# Patient Record
Sex: Male | Born: 1985 | Race: Black or African American | Hispanic: No | Marital: Married | Smoking: Current every day smoker
Health system: Southern US, Community
[De-identification: ages and names within clinical notes are randomized; demographics above are authoritative.]

## PROBLEM LIST (undated history)

## (undated) DIAGNOSIS — J309 Allergic rhinitis, unspecified: Secondary | ICD-10-CM

## (undated) DIAGNOSIS — M1990 Unspecified osteoarthritis, unspecified site: Secondary | ICD-10-CM

## (undated) DIAGNOSIS — J45909 Unspecified asthma, uncomplicated: Secondary | ICD-10-CM

## (undated) HISTORY — PX: VASECTOMY: SHX75

## (undated) HISTORY — PX: HERNIA REPAIR: SHX51

---

## 2016-01-27 ENCOUNTER — Emergency Department (HOSPITAL_BASED_OUTPATIENT_CLINIC_OR_DEPARTMENT_OTHER)
Admission: EM | Admit: 2016-01-27 | Discharge: 2016-01-27 | Disposition: A | Discharge status: Home / Self Care | Payer: No Typology Code available for payment source | Attending: Emergency Medicine | Admitting: Emergency Medicine

## 2016-01-27 ENCOUNTER — Emergency Department (HOSPITAL_BASED_OUTPATIENT_CLINIC_OR_DEPARTMENT_OTHER): Payer: No Typology Code available for payment source

## 2016-01-27 ENCOUNTER — Encounter (HOSPITAL_BASED_OUTPATIENT_CLINIC_OR_DEPARTMENT_OTHER): Payer: Self-pay | Admitting: Emergency Medicine

## 2016-01-27 DIAGNOSIS — T148 Other injury of unspecified body region: Secondary | ICD-10-CM | POA: Insufficient documentation

## 2016-01-27 DIAGNOSIS — Y998 Other external cause status: Secondary | ICD-10-CM | POA: Diagnosis not present

## 2016-01-27 DIAGNOSIS — S3992XA Unspecified injury of lower back, initial encounter: Secondary | ICD-10-CM | POA: Insufficient documentation

## 2016-01-27 DIAGNOSIS — Y9389 Activity, other specified: Secondary | ICD-10-CM | POA: Insufficient documentation

## 2016-01-27 DIAGNOSIS — F17210 Nicotine dependence, cigarettes, uncomplicated: Secondary | ICD-10-CM | POA: Insufficient documentation

## 2016-01-27 DIAGNOSIS — Y92410 Unspecified street and highway as the place of occurrence of the external cause: Secondary | ICD-10-CM | POA: Diagnosis not present

## 2016-01-27 DIAGNOSIS — T148XXA Other injury of unspecified body region, initial encounter: Secondary | ICD-10-CM

## 2016-01-27 DIAGNOSIS — S29001A Unspecified injury of muscle and tendon of front wall of thorax, initial encounter: Secondary | ICD-10-CM | POA: Insufficient documentation

## 2016-01-27 DIAGNOSIS — S0990XA Unspecified injury of head, initial encounter: Secondary | ICD-10-CM | POA: Diagnosis present

## 2016-01-27 MED ORDER — IBUPROFEN 800 MG PO TABS
800.0000 mg | ORAL_TABLET | Freq: Once | ORAL | Status: AC
  Administered 2016-01-27: 800 mg via ORAL
  Filled 2016-01-27: qty 1

## 2016-01-27 MED ORDER — CYCLOBENZAPRINE HCL 10 MG PO TABS: 10.0000 mg | ORAL_TABLET | Freq: Two times a day (BID) | ORAL | Status: DC | PRN

## 2016-01-27 MED ORDER — IBUPROFEN 800 MG PO TABS: 800.0000 mg | ORAL_TABLET | Freq: Three times a day (TID) | ORAL | Status: DC
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
## 2016-01-27 NOTE — ED Notes (Signed)
MD at bedside. 
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
## 2018-03-29 ENCOUNTER — Encounter (HOSPITAL_BASED_OUTPATIENT_CLINIC_OR_DEPARTMENT_OTHER): Payer: Self-pay

## 2018-03-29 ENCOUNTER — Emergency Department (HOSPITAL_BASED_OUTPATIENT_CLINIC_OR_DEPARTMENT_OTHER)
Admission: EM | Admit: 2018-03-29 | Discharge: 2018-03-29 | Disposition: A | Discharge status: Home / Self Care | Payer: Medicaid Other | Attending: Emergency Medicine | Admitting: Emergency Medicine

## 2018-03-29 ENCOUNTER — Emergency Department (HOSPITAL_BASED_OUTPATIENT_CLINIC_OR_DEPARTMENT_OTHER): Payer: Medicaid Other

## 2018-03-29 ENCOUNTER — Other Ambulatory Visit: Payer: Self-pay

## 2018-03-29 DIAGNOSIS — J029 Acute pharyngitis, unspecified: Secondary | ICD-10-CM | POA: Diagnosis present

## 2018-03-29 DIAGNOSIS — F17210 Nicotine dependence, cigarettes, uncomplicated: Secondary | ICD-10-CM | POA: Insufficient documentation

## 2018-03-29 DIAGNOSIS — J039 Acute tonsillitis, unspecified: Secondary | ICD-10-CM | POA: Diagnosis not present

## 2018-03-29 DIAGNOSIS — J4521 Mild intermittent asthma with (acute) exacerbation: Secondary | ICD-10-CM | POA: Insufficient documentation

## 2018-03-29 HISTORY — DX: Allergic rhinitis, unspecified: J30.9

## 2018-03-29 LAB — RAPID STREP SCREEN (MED CTR MEBANE ONLY): STREPTOCOCCUS, GROUP A SCREEN (DIRECT): NEGATIVE

## 2018-03-29 MED ORDER — AZITHROMYCIN 250 MG PO TABS: ORAL_TABLET | ORAL | 0 refills | Status: DC

## 2018-03-29 MED ORDER — AZITHROMYCIN 250 MG PO TABS
500.0000 mg | ORAL_TABLET | Freq: Once | ORAL | Status: AC
  Administered 2018-03-29: 500 mg via ORAL
  Filled 2018-03-29: qty 2

## 2018-03-29 MED ORDER — ALBUTEROL SULFATE (2.5 MG/3ML) 0.083% IN NEBU: 2.5000 mg | INHALATION_SOLUTION | Freq: Four times a day (QID) | RESPIRATORY_TRACT | 12 refills | Status: AC | PRN

## 2018-03-29 MED ORDER — PREDNISONE 10 MG (21) PO TBPK: ORAL_TABLET | ORAL | 0 refills | Status: DC

## 2018-03-29 MED ORDER — AEROCHAMBER PLUS FLO-VU MEDIUM MISC
1.0000 | Freq: Once | Status: AC
  Administered 2018-03-29: 1
  Filled 2018-03-29: qty 1

## 2018-03-29 MED ORDER — ALBUTEROL SULFATE (2.5 MG/3ML) 0.083% IN NEBU
2.5000 mg | INHALATION_SOLUTION | Freq: Once | RESPIRATORY_TRACT | Status: AC
  Administered 2018-03-29: 2.5 mg via RESPIRATORY_TRACT
  Filled 2018-03-29: qty 3

## 2018-03-29 MED ORDER — ALBUTEROL SULFATE HFA 108 (90 BASE) MCG/ACT IN AERS
1.0000 | INHALATION_SPRAY | RESPIRATORY_TRACT | Status: DC | PRN
  Administered 2018-03-29: 2 via RESPIRATORY_TRACT
  Filled 2018-03-29: qty 6.7

## 2018-03-29 MED ORDER — IPRATROPIUM-ALBUTEROL 0.5-2.5 (3) MG/3ML IN SOLN
3.0000 mL | Freq: Four times a day (QID) | RESPIRATORY_TRACT | Status: DC
  Administered 2018-03-29: 3 mL via RESPIRATORY_TRACT
  Filled 2018-03-29: qty 3

## 2018-03-29 MED ORDER — DEXAMETHASONE SODIUM PHOSPHATE 10 MG/ML IJ SOLN
10.0000 mg | Freq: Once | INTRAMUSCULAR | Status: AC
  Administered 2018-03-29: 10 mg via INTRAVENOUS
  Filled 2018-03-29: qty 1

## 2018-03-29 MED ORDER — IBUPROFEN 800 MG PO TABS: 800.0000 mg | ORAL_TABLET | Freq: Three times a day (TID) | ORAL | 0 refills | Status: DC

## 2018-03-29 NOTE — ED Provider Notes (Signed)
[facility] EMERGENCY DEPARTMENT Provider Note   CSN: [id number] Arrival date & time: 03/29/18  0805     History   Chief Complaint Chief Complaint  Patient presents with  . Sore Throat    HPI Nathan Logan is a 32 y.o. male.  Pt presents to the ED today with sore throat and cough.  Pt said sx have been going on since Monday the 22nd and not getting better.  Pt has not had a fever.  He has a hx of asthma and is out of his albuterol neb soln.  He is able to drink and to eat.     Past Medical History:  Diagnosis Date  . Allergic rhinitis     There are no active problems to display for this patient.   Past Surgical History:  Procedure Laterality Date  . HERNIA REPAIR          Home Medications    Prior to Admission medications   Medication Sig Start Date End Date Taking? Authorizing Provider  azithromycin (ZITHROMAX) 250 MG tablet Take 1 tablet daily 03/30/18   Jacalyn Lefevre, MD  cyclobenzaprine (FLEXERIL) 10 MG tablet Take 1 tablet (10 mg total) by mouth 2 (two) times daily as needed for muscle spasms. 01/27/16   Tilden Fossa, MD  ibuprofen (ADVIL,MOTRIN) 800 MG tablet Take 1 tablet (800 mg total) by mouth 3 (three) times daily. 03/29/18   Jacalyn Lefevre, MD  predniSONE (STERAPRED UNI-PAK 21 TAB) 10 MG (21) TBPK tablet Take 6 tabs by mouth daily  for 2 days, then 5 tabs for 2 days, then 4 tabs for 2 days, then 3 tabs for 2 days, 2 tabs for 2 days, then 1 tab by mouth daily for 2 days 03/29/18   Jacalyn Lefevre, MD    Family History History reviewed. No pertinent family history.  Social History Social History   Tobacco Use  . Smoking status: Current Every Day Smoker    Packs/day: 1.00    Types: Cigarettes  . Smokeless tobacco: Former Engineer, water Use Topics  . Alcohol use: Yes    Comment: occasional  . Drug use: Never     Allergies   Patient has no known allergies.   Review of Systems Review of Systems  HENT: Positive for  sore throat.   Respiratory: Positive for cough and wheezing.   All other systems reviewed and are negative.    Physical Exam Updated Vital Signs BP (!) 143/94 (BP Location: Right Arm)   Pulse 77   Temp 98.4 F (36.9 C) (Oral)   Resp 16   Ht  (1.88 m)   Wt 127 kg (280 lb)   SpO2 98%   BMI 35.95 kg/m   Physical Exam  Constitutional: He is oriented to person, place, and time. He appears well-developed and well-nourished.  HENT:  Head: Normocephalic and atraumatic.  Right Ear: Hearing, tympanic membrane and ear canal normal.  Left Ear: Hearing, tympanic membrane and ear canal normal.  Mouth/Throat: Uvula is midline and mucous membranes are normal. Posterior oropharyngeal erythema present.  Eyes: Pupils are equal, round, and reactive to light. EOM are normal.  Neck: Normal range of motion. Neck supple.  Cardiovascular: Normal rate, regular rhythm, normal heart sounds and intact distal pulses.  Pulmonary/Chest: He has wheezes.  Abdominal: Soft. Bowel sounds are normal.  Lymphadenopathy:    He has cervical adenopathy.  Neurological: He is alert and oriented to person, place, and time.  Skin: Skin is warm and  dry. Capillary refill takes less than 2 seconds.  Psychiatric: He has a normal mood and affect. His behavior is normal.  Nursing note and vitals reviewed.    ED Treatments / Results  Labs (all labs ordered are listed, but only abnormal results are displayed) Labs Reviewed  RAPID STREP SCREEN ([facility] ONLY)  CULTURE, GROUP A STREP [facility])    EKG None  Radiology Dg Chest 2 View  Result Date: 03/29/2018 CLINICAL DATA:  Cough, chest congestion and sore throat for 6 days. EXAM: CHEST - 2 VIEW COMPARISON:  04/13/2017 FINDINGS: The heart size and mediastinal contours are within normal limits. Both lungs are clear. The visualized skeletal structures are unremarkable. IMPRESSION: No active cardiopulmonary disease. Electronically Signed   By: Myles Rosenthal M.D.   On:  03/29/2018 09:27    Procedures Procedures (including critical care time)  Medications Ordered in ED Medications  ipratropium-albuterol (DUONEB) 0.5-2.5 (3) MG/3ML nebulizer solution 3 mL (3 mLs Nebulization Given 03/29/18 0854)  albuterol (PROVENTIL HFA;VENTOLIN HFA) 108 (90 Base) MCG/ACT inhaler 1-2 puff (has no administration in time range)  AEROCHAMBER PLUS FLO-VU MEDIUM MISC 1 each (has no administration in time range)  azithromycin (ZITHROMAX) tablet 500 mg (has no administration in time range)  albuterol (PROVENTIL) (2.5 MG/3ML) 0.083% nebulizer solution 2.5 mg (has no administration in time range)  dexamethasone (DECADRON) injection 10 mg (10 mg Intravenous Given 03/29/18 0845)     Initial Impression / Assessment and Plan / ED Course  I have reviewed the triage vital signs and the nursing notes.  Pertinent labs & imaging results that were available during my care of the patient were reviewed by me and considered in my medical decision making (see chart for details).    Pt is feeling better.  As sx have been going on for a week and tonsils look bad, he will be treated with abx.  He is encouraged to stop smoking.  He is given an albuterol inhaler with spacer prior to d/c.  He is told to return if worse.  Establish primary care.  Final Clinical Impressions(s) / ED Diagnoses   Final diagnoses:  Tonsillitis  Mild intermittent reactive airway disease with acute exacerbation    ED Discharge Orders        Ordered    azithromycin (ZITHROMAX) 250 MG tablet     03/29/18 0957    predniSONE (STERAPRED UNI-PAK 21 TAB) 10 MG (21) TBPK tablet     03/29/18 0957    ibuprofen (ADVIL,MOTRIN) 800 MG tablet  3 times daily     03/29/18 0957       Jacalyn Lefevre, MD 03/29/18 [phone]

## 2018-03-29 NOTE — Discharge Instructions (Signed)
Try to stop smoking. °

## 2018-03-29 NOTE — ED Triage Notes (Addendum)
Pt reports sore throat since Monday. Denies fever. Associated cough.

## 2018-03-29 NOTE — ED Notes (Signed)
RT explained MDI with spacer to patient. He stated he has used them in the past and had no further questions. Left both at bedside with the patient.

## 2018-04-01 LAB — CULTURE, GROUP A STREP (THRC)

## 2019-06-07 ENCOUNTER — Other Ambulatory Visit: Payer: Self-pay

## 2019-06-07 ENCOUNTER — Emergency Department (HOSPITAL_BASED_OUTPATIENT_CLINIC_OR_DEPARTMENT_OTHER)
Admission: EM | Admit: 2019-06-07 | Discharge: 2019-06-07 | Disposition: A | Discharge status: Home / Self Care | Payer: Commercial Managed Care - PPO | Attending: Emergency Medicine | Admitting: Emergency Medicine

## 2019-06-07 ENCOUNTER — Encounter (HOSPITAL_BASED_OUTPATIENT_CLINIC_OR_DEPARTMENT_OTHER): Payer: Self-pay | Admitting: Emergency Medicine

## 2019-06-07 DIAGNOSIS — F17210 Nicotine dependence, cigarettes, uncomplicated: Secondary | ICD-10-CM | POA: Insufficient documentation

## 2019-06-07 DIAGNOSIS — M25561 Pain in right knee: Secondary | ICD-10-CM | POA: Diagnosis not present

## 2019-06-07 DIAGNOSIS — M79675 Pain in left toe(s): Secondary | ICD-10-CM | POA: Diagnosis not present

## 2019-06-07 HISTORY — DX: Unspecified osteoarthritis, unspecified site: M19.90

## 2019-06-07 LAB — SYNOVIAL CELL COUNT + DIFF, W/ CRYSTALS
Crystals, Fluid: NONE SEEN
Eosinophils-Synovial: 0 % (ref 0–1)
Lymphocytes-Synovial Fld: 16 % (ref 0–20)
Monocyte-Macrophage-Synovial Fluid: 78 % (ref 50–90)
Neutrophil, Synovial: 6 % (ref 0–25)
WBC, Synovial: 350 /mm3 — ABNORMAL HIGH (ref 0–200)

## 2019-06-07 MED ORDER — KETOROLAC TROMETHAMINE 30 MG/ML IJ SOLN
30.0000 mg | Freq: Once | INTRAMUSCULAR | Status: AC
  Administered 2019-06-07: 30 mg via INTRAMUSCULAR

## 2019-06-07 MED ORDER — LIDOCAINE-EPINEPHRINE (PF) 2 %-1:200000 IJ SOLN
20.0000 mL | Freq: Once | INTRAMUSCULAR | Status: DC
  Filled 2019-06-07: qty 20

## 2019-06-07 MED ORDER — NAPROXEN 500 MG PO TABS: 500.0000 mg | ORAL_TABLET | Freq: Two times a day (BID) | ORAL | 0 refills | Status: AC

## 2019-06-07 MED ORDER — HYDROCODONE-ACETAMINOPHEN 5-325 MG PO TABS: 1.0000 | ORAL_TABLET | ORAL | 0 refills | Status: DC | PRN

## 2019-06-07 MED ORDER — LIDOCAINE-EPINEPHRINE (PF) 2 %-1:200000 IJ SOLN
INTRAMUSCULAR | Status: AC
  Filled 2019-06-07: qty 10

## 2019-06-07 MED ORDER — KETOROLAC TROMETHAMINE 30 MG/ML IJ SOLN
INTRAMUSCULAR | Status: AC
  Filled 2019-06-07: qty 1

## 2019-06-07 NOTE — ED Notes (Signed)
Pt understood dc material. NAD noted. Scripts given at [employer]. All questions answered to satisfaction. Pt escorted to checkout counter. Pt given call back number to check on results.

## 2019-06-07 NOTE — ED Provider Notes (Signed)
[facility] EMERGENCY DEPARTMENT Provider Note   CSN: [id number] Arrival date & time: 06/07/19  0607     History   Chief Complaint Chief Complaint  Patient presents with  . Knee Pain  . Toe Pain    [facility] is a 33 y.[facility]. male.     HPI  This is a 33 year old male with no significant past medical history who presents with knee pain and toe pain.  Patient reports 1 week history of worsening left great toe pain.  He denies any injury.  He took an ibuprofen with minimal relief.  He has also noted pain and swelling over the lateral aspect of his right knee.  He denies any injury.  He denies any fevers or overlying skin changes.  No known history of gout.  Patient does report remote history of drinking but states he is not recently had any beer or alcohol.  Currently he rates his pain at 7 out of 10.  Past Medical History:  Diagnosis Date  . Allergic rhinitis   . Arthritis     There are no active problems to display for this patient.   Past Surgical History:  Procedure Laterality Date  . HERNIA REPAIR          Home Medications    Prior to Admission medications   Medication Sig Start Date End Date Taking? Authorizing Provider  albuterol (PROVENTIL) (2.5 MG/3ML) 0.083% nebulizer solution Take 3 mLs (2.5 mg total) by nebulization every 6 (six) hours as needed for wheezing or shortness of breath. 03/29/18   Jacalyn LefevreHaviland, Julie, MD  azithromycin (ZITHROMAX) 250 MG tablet Take 1 tablet daily 03/30/18   Jacalyn LefevreHaviland, Julie, MD  cyclobenzaprine (FLEXERIL) 10 MG tablet Take 1 tablet (10 mg total) by mouth 2 (two) times daily as needed for muscle spasms. 01/27/16   Tilden Fossaees, Elizabeth, MD  HYDROcodone-acetaminophen (NORCO/VICODIN) 5-325 MG tablet Take 1 tablet by mouth every 4 (four) hours as needed. 06/07/19   Kennidi Yoshida, Mayer Maskerourtney F, MD  ibuprofen (ADVIL,MOTRIN) 800 MG tablet Take 1 tablet (800 mg total) by mouth 3 (three) times daily. 03/29/18   Jacalyn LefevreHaviland, Julie, MD  naproxen  (NAPROSYN) 500 MG tablet Take 1 tablet (500 mg total) by mouth 2 (two) times daily. 06/07/19   Brynnly Bonet, Mayer Maskerourtney F, MD  predniSONE (STERAPRED UNI-PAK 21 TAB) 10 MG (21) TBPK tablet Take 6 tabs by mouth daily  for 2 days, then 5 tabs for 2 days, then 4 tabs for 2 days, then 3 tabs for 2 days, 2 tabs for 2 days, then 1 tab by mouth daily for 2 days 03/29/18   Jacalyn LefevreHaviland, Julie, MD    Family History No family history on file.  Social History Social History   Tobacco Use  . Smoking status: Current Every Day Smoker    Packs/day: 1.00    Types: Cigarettes  . Smokeless tobacco: Former Engineer, waterUser  Substance Use Topics  . Alcohol use: Yes    Comment: occasional  . Drug use: Never     Allergies   Patient has no known allergies.   Review of Systems Review of Systems  Constitutional: Negative for fever.  Musculoskeletal: Positive for joint swelling.       Right knee pain, left toe pain  Skin: Negative for color change and wound.  All other systems reviewed and are negative.    Physical Exam Updated Vital Signs BP (!) 126/91   Pulse 81   Temp 97.9 F (36.6 C) (Oral)   Resp 18  Ht 1.88 m (6\' 2" )   Wt 131.5 kg   SpO2 99%   BMI 37.23 kg/m   Physical Exam Vitals signs and nursing note reviewed.  Constitutional:      General: He is not in acute distress.    Appearance: He is well-developed. He is obese.  HENT:     Head: Normocephalic and atraumatic.  Cardiovascular:     Rate and Rhythm: Normal rate and regular rhythm.  Pulmonary:     Effort: Pulmonary effort is normal. No respiratory distress.  Musculoskeletal: Normal range of motion.     Comments: Focused examination of the right knee with normal range of motion both actively and passively, effusion noted over the right lateral joint line more prominently with the knee in flexion, no overlying skin changes, erythema, warmth Tenderness to palpation left MTP, no overlying skin changes or warmth, normal range of motion, 2+ DP pulse   Skin:    General: Skin is warm and dry.  Neurological:     Mental Status: He is alert and oriented to person, place, and time.      ED Treatments / Results  Labs (all labs ordered are listed, but only abnormal results are displayed) Labs Reviewed  BODY FLUID CULTURE  GRAM STAIN  SYNOVIAL CELL COUNT + DIFF, W/ CRYSTALS    EKG None  Radiology No results found.  Procedures .Joint Aspiration/Arthrocentesis  Date/Time: 06/07/2019 7:06 AM Performed by: Shon BatonHorton, Latroya Ng F, MD Authorized by: Shon BatonHorton, Cornell Gaber F, MD   Consent:    Consent obtained:  Written   Consent given by:  Patient   Risks discussed:  Bleeding, infection and incomplete drainage   Alternatives discussed:  No treatment Location:    Location:  Knee   Knee:  R knee Anesthesia (see MAR for exact dosages):    Anesthesia method:  Local infiltration   Local anesthetic:  Lidocaine 2% WITH epi Procedure details:    Preparation: Patient was prepped and draped in usual sterile fashion     Needle gauge:  18 G   Ultrasound guidance: yes     Approach:  Lateral   Aspirate amount:  3   Aspirate characteristics:  Serous and blood-tinged   Steroid injected: no     Specimen collected: yes   Post-procedure details:    Dressing:  Adhesive bandage   Patient tolerance of procedure:  Tolerated well, no immediate complications   (including critical care time)  Medications Ordered in ED Medications  lidocaine-EPINEPHrine (XYLOCAINE W/EPI) 2 %-1:200000 (PF) injection 20 mL (has no administration in time range)  ketorolac (TORADOL) 30 MG/ML injection 30 mg (30 mg Intramuscular Given 06/07/19 [id number])     Initial Impression / Assessment and Plan / ED Course  I have reviewed the triage vital signs and the nursing notes.  Pertinent labs & imaging results that were available during my care of the patient were reviewed by me and considered in my medical decision making (see chart for details).        Patient presents with  atraumatic right knee pain and left great toe pain.  He is overall nontoxic-appearing and vital signs are reassuring.  He is afebrile.  His exam is highly suspicious for gout given location of pain.  Given obvious effusion on exam, he underwent arthrocentesis with aspiration of serous, straw-colored fluid.  He tolerated the procedure well.  Patient was given Toradol.  Will treat with anti-inflammatories and a short course of Norco for presumed gout.  Fluid analysis is pending.  Patient was given a number to call back for results.  After history, exam, and medical workup I feel the patient has been appropriately medically screened and is safe for discharge home. Pertinent diagnoses were discussed with the patient. Patient was given return precautions.  Final Clinical Impressions(s) / ED Diagnoses   Final diagnoses:  Acute pain of right knee  Great toe pain, left    ED Discharge Orders         Ordered    naproxen (NAPROSYN) 500 MG tablet  2 times daily     06/07/19 0656    HYDROcodone-acetaminophen (NORCO/VICODIN) 5-325 MG tablet  Every 4 hours PRN     06/07/19 0656           Merryl Hacker, MD 06/07/19 [id number]

## 2019-06-07 NOTE — ED Notes (Signed)
ED Provider at bedside. 

## 2019-06-07 NOTE — ED Triage Notes (Signed)
Pt c/o of right "knee bump" that he noticed over the past two days but pain became worse this morning. Denies injury to knee. Also c/o left great toe pain at the knuckle. Denies recent injury. Took motrin around 2200 last night

## 2019-06-09 ENCOUNTER — Encounter: Payer: Self-pay | Admitting: Orthopaedic Surgery

## 2019-06-09 ENCOUNTER — Emergency Department (HOSPITAL_BASED_OUTPATIENT_CLINIC_OR_DEPARTMENT_OTHER)
Admission: EM | Admit: 2019-06-09 | Discharge: 2019-06-09 | Disposition: A | Discharge status: Home / Self Care | Payer: Commercial Managed Care - PPO | Attending: Emergency Medicine | Admitting: Emergency Medicine

## 2019-06-09 ENCOUNTER — Encounter (HOSPITAL_BASED_OUTPATIENT_CLINIC_OR_DEPARTMENT_OTHER): Payer: Self-pay | Admitting: Emergency Medicine

## 2019-06-09 ENCOUNTER — Ambulatory Visit: Payer: Self-pay

## 2019-06-09 ENCOUNTER — Ambulatory Visit (INDEPENDENT_AMBULATORY_CARE_PROVIDER_SITE_OTHER): Payer: Commercial Managed Care - PPO | Admitting: Orthopaedic Surgery

## 2019-06-09 ENCOUNTER — Other Ambulatory Visit: Payer: Self-pay

## 2019-06-09 DIAGNOSIS — M25561 Pain in right knee: Secondary | ICD-10-CM | POA: Insufficient documentation

## 2019-06-09 DIAGNOSIS — M25461 Effusion, right knee: Secondary | ICD-10-CM | POA: Diagnosis not present

## 2019-06-09 DIAGNOSIS — J45909 Unspecified asthma, uncomplicated: Secondary | ICD-10-CM | POA: Insufficient documentation

## 2019-06-09 DIAGNOSIS — F17210 Nicotine dependence, cigarettes, uncomplicated: Secondary | ICD-10-CM | POA: Insufficient documentation

## 2019-06-09 HISTORY — DX: Unspecified asthma, uncomplicated: J45.909

## 2019-06-09 MED ORDER — CELECOXIB 200 MG PO CAPS: 200.0000 mg | ORAL_CAPSULE | Freq: Two times a day (BID) | ORAL | 0 refills | Status: AC

## 2019-06-09 MED ORDER — BUPIVACAINE HCL 0.5 % IJ SOLN
2.0000 mL | INTRAMUSCULAR | Status: AC | PRN
  Administered 2019-06-09: 2 mL via INTRA_ARTICULAR

## 2019-06-09 MED ORDER — METHYLPREDNISOLONE ACETATE 40 MG/ML IJ SUSP
40.0000 mg | INTRAMUSCULAR | Status: AC | PRN
  Administered 2019-06-09: 40 mg via INTRA_ARTICULAR

## 2019-06-09 MED ORDER — LIDOCAINE HCL 1 % IJ SOLN
2.0000 mL | INTRAMUSCULAR | Status: AC | PRN
  Administered 2019-06-09: 2 mL

## 2019-06-09 NOTE — ED Triage Notes (Signed)
Right knee pain worse since being seen here 2 days ago.  Vicodin not helping.

## 2019-06-09 NOTE — ED Provider Notes (Signed)
[facility] EMERGENCY DEPARTMENT Provider Note   CSN: [id number] Arrival date & time: 06/09/19  5852    History   Chief Complaint Chief Complaint  Patient presents with  . Knee Pain    HPI [facility] is a 33 y.o. male.     Pt presents to the ED today with continued right knee pain.  Pt was initially seen here on 7/6 by Dr. Dina Rich.  She performed a knee arthrocentesis.  The cultures showed no growth and WBCs only slightly elevated.  Pt was d/c home on vicodin.  Pain is not improving.     Past Medical History:  Diagnosis Date  . Allergic rhinitis   . Arthritis   . Asthma     There are no active problems to display for this patient.   Past Surgical History:  Procedure Laterality Date  . HERNIA REPAIR          Home Medications    Prior to Admission medications   Medication Sig Start Date End Date Taking? Authorizing Provider  albuterol (PROVENTIL) (2.5 MG/3ML) 0.083% nebulizer solution Take 3 mLs (2.5 mg total) by nebulization every 6 (six) hours as needed for wheezing or shortness of breath. 03/29/18   Isla Pence, MD  celecoxib (CELEBREX) 200 MG capsule Take 1 capsule (200 mg total) by mouth 2 (two) times daily. 06/09/19   Isla Pence, MD  HYDROcodone-acetaminophen (NORCO/VICODIN) 5-325 MG tablet Take 1 tablet by mouth every 4 (four) hours as needed. 06/07/19   Horton, Barbette Hair, MD  naproxen (NAPROSYN) 500 MG tablet Take 1 tablet (500 mg total) by mouth 2 (two) times daily. 06/07/19   Horton, Barbette Hair, MD    Family History No family history on file.  Social History Social History   Tobacco Use  . Smoking status: Current Every Day Smoker    Packs/day: 0.50    Types: Cigarettes  . Smokeless tobacco: Former Network engineer Use Topics  . Alcohol use: Yes    Comment: occasional  . Drug use: Never     Allergies   Patient has no known allergies.   Review of Systems Review of Systems  Musculoskeletal:       Right knee  pain  All other systems reviewed and are negative.    Physical Exam Updated Vital Signs BP 135/83 (BP Location: Right Arm)   Pulse 73   Temp 98.1 F (36.7 C) (Oral)   Resp 16   Ht 6\' 2"  (1.88 m)   Wt 131.5 kg   SpO2 100%   BMI 37.23 kg/m   Physical Exam Vitals signs and nursing note reviewed.  Constitutional:      Appearance: Normal appearance.  HENT:     Head: Normocephalic and atraumatic.     Right Ear: External ear normal.     Left Ear: External ear normal.     Nose: Nose normal.     Mouth/Throat:     Mouth: Mucous membranes are moist.  Eyes:     Extraocular Movements: Extraocular movements intact.     Conjunctiva/sclera: Conjunctivae normal.     Pupils: Pupils are equal, round, and reactive to light.  Neck:     Musculoskeletal: Normal range of motion and neck supple.  Cardiovascular:     Rate and Rhythm: Normal rate and regular rhythm.     Pulses: Normal pulses.     Heart sounds: Normal heart sounds.  Pulmonary:     Effort: Pulmonary effort is normal.  Breath sounds: Normal breath sounds.  Abdominal:     General: Abdomen is flat. Bowel sounds are normal.  Musculoskeletal:     Right knee: He exhibits decreased range of motion and swelling. He exhibits no erythema. Tenderness found. Lateral joint line tenderness noted.  Skin:    General: Skin is warm.     Capillary Refill: Capillary refill takes less than 2 seconds.  Neurological:     General: No focal deficit present.     Mental Status: He is alert and oriented to person, place, and time.  Psychiatric:        Mood and Affect: Mood normal.        Behavior: Behavior normal.      ED Treatments / Results  Labs (all labs ordered are listed, but only abnormal results are displayed) Labs Reviewed - No data to display  EKG None  Radiology No results found.  Procedures Procedures (including critical care time)  Medications Ordered in ED Medications - No data to display   Initial Impression /  Assessment and Plan / ED Course  I have reviewed the triage vital signs and the nursing notes.  Pertinent labs & imaging results that were available during my care of the patient were reviewed by me and considered in my medical decision making (see chart for details).     Pt given a knee brace and a rx for celebrex.  He is instructed to f/u with ortho.  Return if worse.  Final Clinical Impressions(s) / ED Diagnoses   Final diagnoses:  Acute pain of right knee  Effusion of right knee    ED Discharge Orders         Ordered    celecoxib (CELEBREX) 200 MG capsule  2 times daily     06/09/19 0755           Jacalyn LefevreHaviland, Jayvin Hurrell, MD 06/09/19 [phone]

## 2019-06-09 NOTE — Addendum Note (Signed)
Addended by: Minda Ditto, Alyse Low N on: 06/09/2019 01:57 PM   Modules accepted: Orders

## 2019-06-09 NOTE — ED Notes (Signed)
ED Provider at bedside. 

## 2019-06-09 NOTE — Progress Notes (Signed)
Office Visit Note   Patient: Nathan Logan           Date of Birth: 1986/01/20           MRN: [id number] Visit Date: 06/09/2019              Requested by: No referring provider defined for this encounter. PCP: Patient, No Pcp Per   Assessment & Plan: Visit Diagnoses:  1. Acute pain of right knee     Plan: Impression is right knee pain and swelling questionable gout vs arthritis flare up.  Repeat aspiration injection performed today.  Fluid was sent to the lab.  We will notify the patient of the results.  I would like to recheck him in 2 weeks to make sure that he is doing okay.  15 cc of blood tinged fluid aspirated.  No frank pus.  Follow-Up Instructions: Return in about 2 weeks (around 06/23/2019).   Orders:  Orders Placed This Encounter  Procedures   Large Joint Inj   XR Knee 1-2 Views Right   No orders of the defined types were placed in this encounter.     Procedures: Large Joint Inj: R knee on 06/09/2019 1:26 PM Indications: pain Details: 22 G needle  Arthrogram: No  Medications: 40 mg methylPREDNISolone acetate 40 MG/ML; 2 mL lidocaine 1 %; 2 mL bupivacaine 0.5 % Consent was given by the patient. Patient was prepped and draped in the usual sterile fashion.       Clinical Data: No additional findings.   Subjective: Chief Complaint  Patient presents with   Right Knee - Pain    Nathan Logan is a healthy 33 year old gentleman comes in for evaluation of right knee pain and swelling that started insidiously over the weekend.  He denies any definite injuries.  He states that he has been playing basketball other than that he cannot recall any changes in activity.  He denies a history of gout.  Denies any constitutional symptoms.  He denies any warmth in the knee.  He feels that there is swelling in the knee.  He went to the ER couple times and they were able to obtain a small amount of fluid which was sent to the lab and was negative for infection or gout  based on cell count.  Cultures are pending.  They did not inject any steroids in the knee.     Review of Systems  Constitutional: Negative.   All other systems reviewed and are negative.    Objective: Vital Signs: VSSAF  Physical Exam Vitals signs and nursing note reviewed.  Constitutional:      Appearance: He is well-developed.  HENT:     Head: Normocephalic and atraumatic.  Eyes:     Pupils: Pupils are equal, round, and reactive to light.  Neck:     Musculoskeletal: Neck supple.  Pulmonary:     Effort: Pulmonary effort is normal.  Abdominal:     Palpations: Abdomen is soft.  Musculoskeletal: Normal range of motion.  Skin:    General: Skin is warm.  Neurological:     Mental Status: He is alert and oriented to person, place, and time.  Psychiatric:        Behavior: Behavior normal.        Thought Content: Thought content normal.        Judgment: Judgment normal.     Ortho Exam Right knee exam - moderate joint effusion - no warmth, redness - ROM is only mildly uncomfortable -  collaterals and cruciates stable  Specialty Comments:  No specialty comments available.  Imaging: Xr Knee 1-2 Views Right  Result Date: 06/09/2019 No acute or structural abnormalities.    PMFS History: There are no active problems to display for this patient.  Past Medical History:  Diagnosis Date   Allergic rhinitis    Arthritis    Asthma     History reviewed. No pertinent family history.  Past Surgical History:  Procedure Laterality Date   HERNIA REPAIR     Social History   Occupational History   Not on file  Tobacco Use   Smoking status: Current Every Day Smoker    Packs/day: 0.50    Types: Cigarettes   Smokeless tobacco: Former Systems developer  Substance and Sexual Activity   Alcohol use: Yes    Comment: occasional   Drug use: Never   Sexual activity: Not on file

## 2019-06-10 LAB — SYNOVIAL CELL COUNT + DIFF, W/ CRYSTALS
Basophils, %: 0 %
Eosinophils-Synovial: 0 % (ref 0–2)
Lymphocytes-Synovial Fld: 57 % (ref 0–74)
Monocyte/Macrophage: 24 % (ref 0–69)
Neutrophil, Synovial: 15 % (ref 0–24)
Synoviocytes, %: 4 % (ref 0–15)
WBC, Synovial: 211 cells/uL — ABNORMAL HIGH (ref <150)

## 2019-06-10 LAB — BODY FLUID CULTURE
Culture: NO GROWTH
Special Requests: NORMAL

## 2019-06-10 NOTE — Progress Notes (Signed)
His knee fluid shows inflammation.  No gout or infection.

## 2019-06-23 ENCOUNTER — Encounter: Payer: Self-pay | Admitting: Orthopaedic Surgery

## 2019-06-23 ENCOUNTER — Other Ambulatory Visit: Payer: Self-pay

## 2019-06-23 ENCOUNTER — Ambulatory Visit (INDEPENDENT_AMBULATORY_CARE_PROVIDER_SITE_OTHER): Payer: Commercial Managed Care - PPO | Admitting: Orthopaedic Surgery

## 2019-06-23 DIAGNOSIS — M25561 Pain in right knee: Secondary | ICD-10-CM | POA: Diagnosis not present

## 2019-06-23 DIAGNOSIS — G8929 Other chronic pain: Secondary | ICD-10-CM | POA: Diagnosis not present

## 2019-06-23 MED ORDER — HYDROCODONE-ACETAMINOPHEN 5-325 MG PO TABS: 1.0000 | ORAL_TABLET | Freq: Every day | ORAL | 0 refills | Status: AC | PRN

## 2019-06-23 NOTE — Progress Notes (Signed)
   Office Visit Note   Patient: Nathan Logan           Date of Birth: 02-Feb-1986           MRN: [id number] Visit Date: 06/23/2019              Requested by: No referring provider defined for this encounter. PCP: Patient, No Pcp Per   Assessment & Plan: Visit Diagnoses:  1. Chronic pain of right knee     Plan: Impression is right knee pain with questionable medial meniscus tear.  We will obtain an MRI to further assess for structural abnormalities.  I will call in one last prescription for Norco.  He will follow-up with [place] once his MRIs been completed.  Follow-Up Instructions: Return for after MRI right knee.   Orders:  Orders Placed This Encounter  Procedures  . MR Knee Right w/o contrast   No orders of the defined types were placed in this encounter.     Procedures: No procedures performed   Clinical Data: No additional findings.   Subjective: Chief Complaint  Patient presents with  . Right Knee - Follow-up    HPI patient is a pleasant 33 year old gentleman who presents our clinic today with recurrent right knee pain and swelling.  He was seen in our office on 06/09/2019 for this.  He has had a total of approximately 3 weeks of right knee pain.  No known injury or change in activity.  His knee has been aspirated and sent for cell count, crystals and culture.  Culture was negative.  Cell count showed nothing more than formation.  His knee was never injected with cortisone.  He has been working as a Editor, commissioning.  Pain is worse when he is on his feet all day.  He has been taking Norco and OTC medications without significant relief of symptoms.  He denies any mechanical symptoms.  No fevers or chills.  Review of Systems as detailed in HPI.  All others reviewed and are negative.   Objective: Vital Signs: There were no vitals taken for this visit.  Physical Exam well-developed well-nourished gentleman in no acute distress.  Alert and oriented x3  Ortho  Exam examination of his right knee shows a trace effusion.  Range of motion 0 to 95 degrees.  He is stable valgus varus stress.  He has marked tenderness medial joint line.  He is neurovascular intact distally.  Specialty Comments:  No specialty comments available.  Imaging: No new imaging   PMFS History: There are no active problems to display for this patient.  Past Medical History:  Diagnosis Date  . Allergic rhinitis   . Arthritis   . Asthma     History reviewed. No pertinent family history.  Past Surgical History:  Procedure Laterality Date  . HERNIA REPAIR     Social History   Occupational History  . Not on file  Tobacco Use  . Smoking status: Current Every Day Smoker    Packs/day: 0.50    Types: Cigarettes  . Smokeless tobacco: Former Network engineer and Sexual Activity  . Alcohol use: Yes    Comment: occasional  . Drug use: Never  . Sexual activity: Not on file

## 2019-07-23 ENCOUNTER — Other Ambulatory Visit: Payer: Self-pay

## 2019-07-23 ENCOUNTER — Ambulatory Visit
Admission: RE | Admit: 2019-07-23 | Discharge: 2019-07-23 | Disposition: A | Discharge status: Home / Self Care | Payer: Commercial Managed Care - PPO | Source: Ambulatory Visit | Attending: Orthopaedic Surgery | Admitting: Orthopaedic Surgery

## 2019-07-23 DIAGNOSIS — M25561 Pain in right knee: Secondary | ICD-10-CM

## 2019-07-23 DIAGNOSIS — G8929 Other chronic pain: Secondary | ICD-10-CM

## 2019-07-27 ENCOUNTER — Ambulatory Visit (INDEPENDENT_AMBULATORY_CARE_PROVIDER_SITE_OTHER): Payer: Commercial Managed Care - PPO | Admitting: Orthopaedic Surgery

## 2019-07-27 DIAGNOSIS — S83411D Sprain of medial collateral ligament of right knee, subsequent encounter: Secondary | ICD-10-CM | POA: Diagnosis not present

## 2019-07-27 NOTE — Progress Notes (Signed)
   Office Visit Note   Patient: Nathan Logan           Date of Birth: 05-21-1986           MRN: [id number] Visit Date: 07/27/2019              Requested by: No referring provider defined for this encounter. PCP: Patient, No Pcp Per   Assessment & Plan: Visit Diagnoses:  1. Sprain of medial collateral ligament of right knee, subsequent encounter     Plan: MRI is negative for meniscal pathology.  Mild edema in the MCL consistent with a grade 1 sprain.  I recommend physical therapy for strengthening and range of motion.  Follow-up as needed.  Follow-Up Instructions: Return if symptoms worsen or fail to improve.   Orders:  No orders of the defined types were placed in this encounter.  No orders of the defined types were placed in this encounter.     Procedures: No procedures performed   Clinical Data: No additional findings.   Subjective: No chief complaint on file.   Nathan Logan is here for MRI review of the right knee.  Overall the knee feels okay.  He still has some discomfort more so posterior laterally now.   Review of Systems   Objective: Vital Signs: There were no vitals taken for this visit.  Physical Exam  Ortho Exam Right knee exam shows a stable MCL with mild discomfort to testing Specialty Comments:  No specialty comments available.  Imaging: No results found.   PMFS History: There are no active problems to display for this patient.  Past Medical History:  Diagnosis Date  . Allergic rhinitis   . Arthritis   . Asthma     No family history on file.  Past Surgical History:  Procedure Laterality Date  . HERNIA REPAIR     Social History   Occupational History  . Not on file  Tobacco Use  . Smoking status: Current Every Day Smoker    Packs/day: 0.50    Types: Cigarettes  . Smokeless tobacco: Former Network engineer and Sexual Activity  . Alcohol use: Yes    Comment: occasional  . Drug use: Never  . Sexual activity: Not on  file

## 2020-01-25 IMAGING — MR MRI OF THE RIGHT KNEE WITHOUT CONTRAST
4 of 7 series · 23 of 40 positions shown · non-contrast
Comparison: None.

CLINICAL DATA: Right knee pain playing ball 2 months ago.

EXAM:
MRI OF THE RIGHT KNEE WITHOUT CONTRAST
TECHNIQUE: Multiplanar, multisequence MR imaging of the knee was performed. No
intravenous contrast was administered.

[Series 5: T1 · coronal · 4.0mm · 0.29mm/px · 5 of 29 slices shown]
[im 1/29]
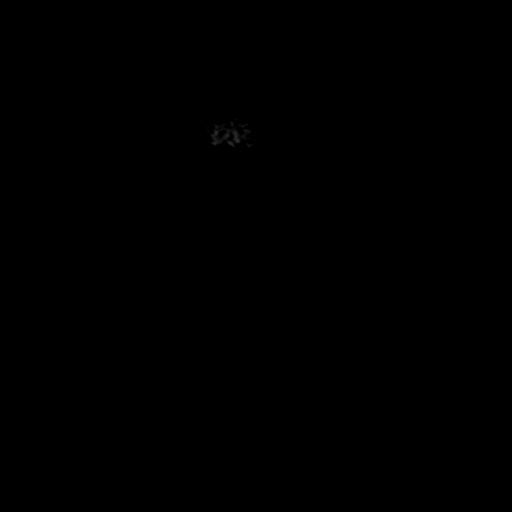
[im 6/29]
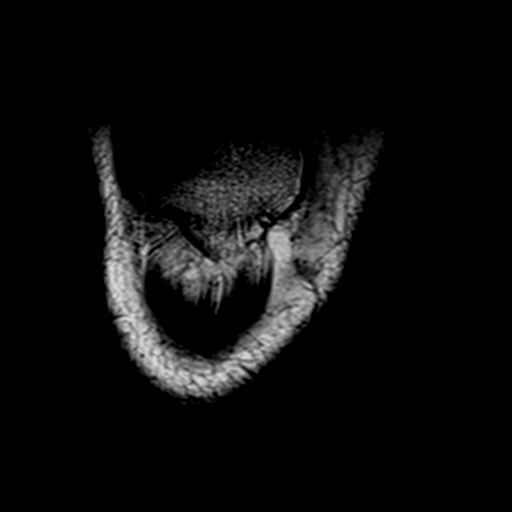
[im 12/29]
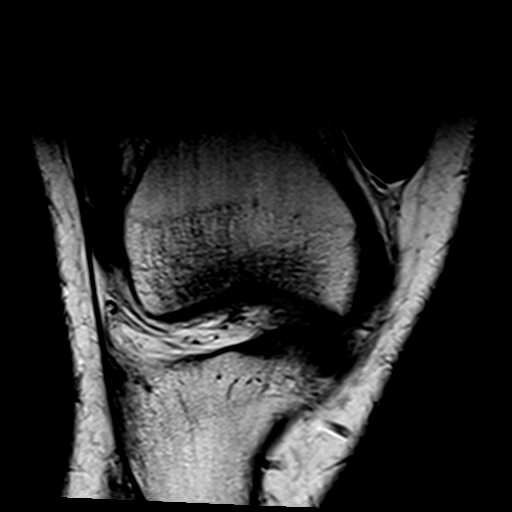
[im 17/29]
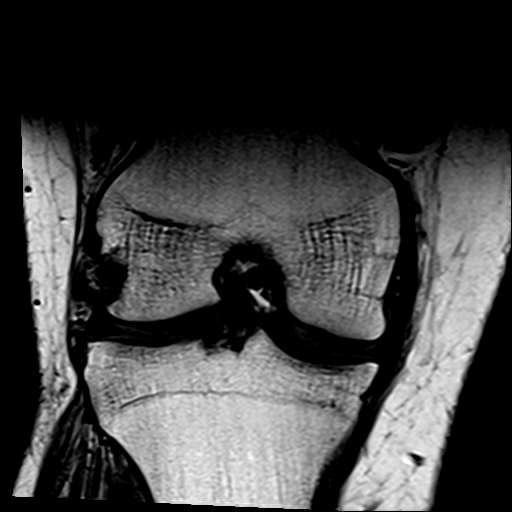
[im 29/29]
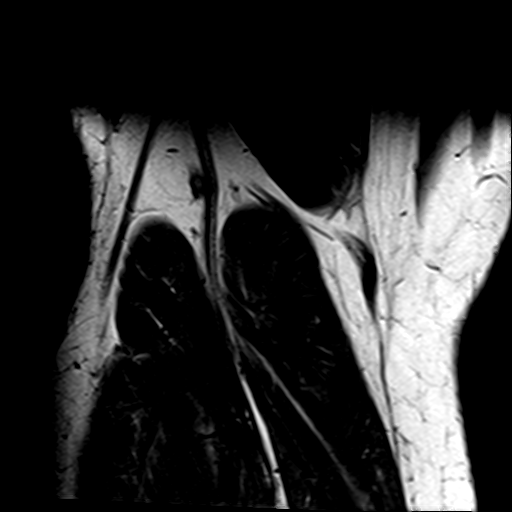

[Series 6: T2 fat-sat · coronal · 4.0mm · 0.59mm/px · 6 of 25 slices shown (1 of 2)]
[im 1/25]
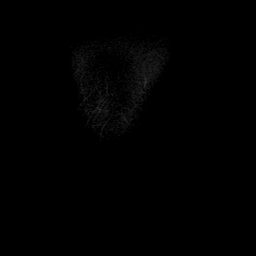
[im 5/25]
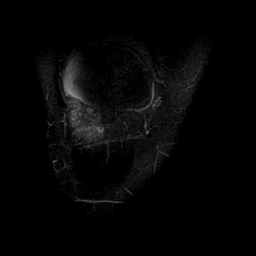
[im 10/25]
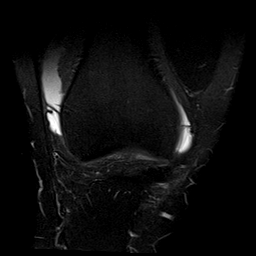
[im 15/25]
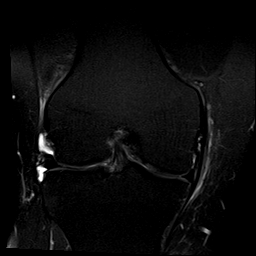
[im 20/25]
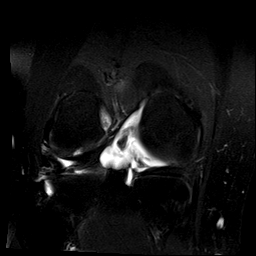
[im 25/25]
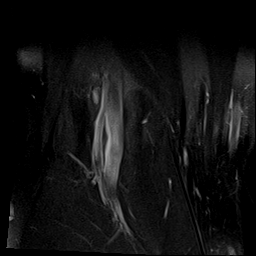

[Series 8: PD fat-sat · sagittal · 3.0mm · 0.29mm/px · 6 of 28 slices shown]
[im 1/28]
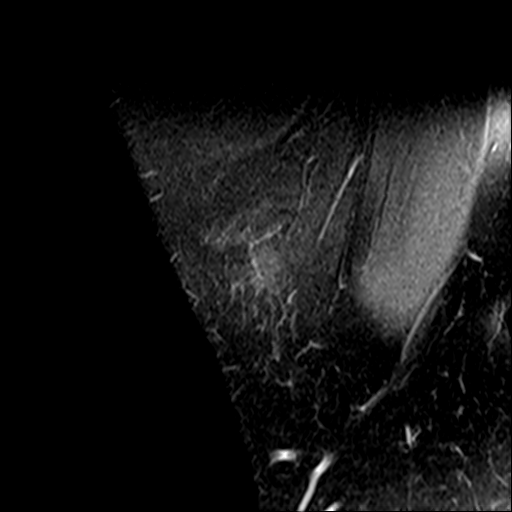
[im 6/28]
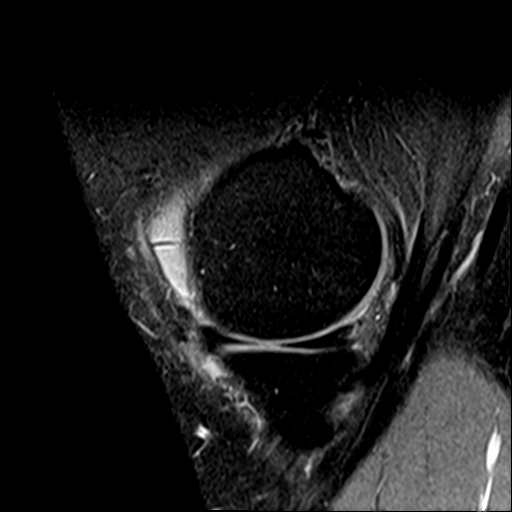
[im 11/28]
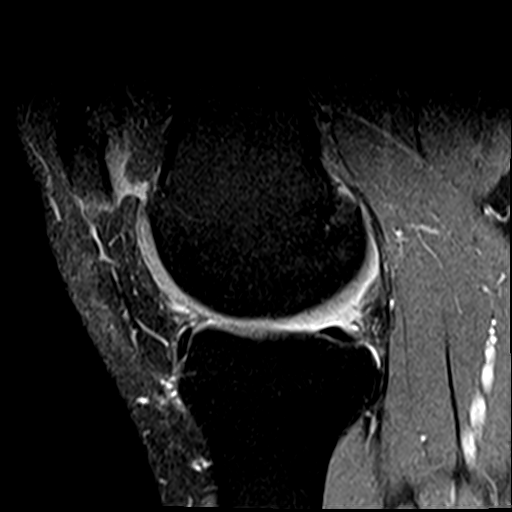
[im 17/28]
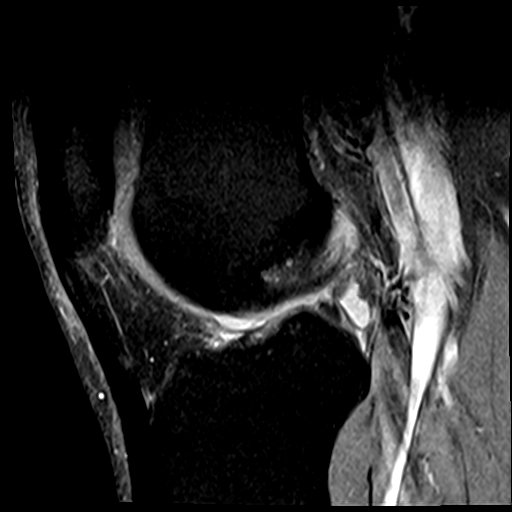
[im 22/28]
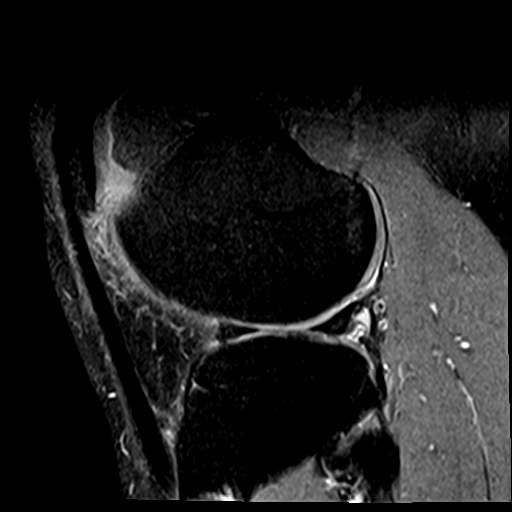
[im 28/28]
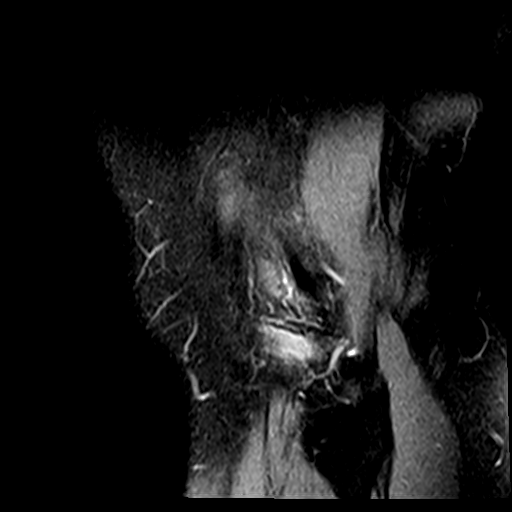

[Series 9: T2 fat-sat · sagittal · 3.0mm · 0.29mm/px · 6 of 28 slices shown (2 of 2)]
[im 1/28]
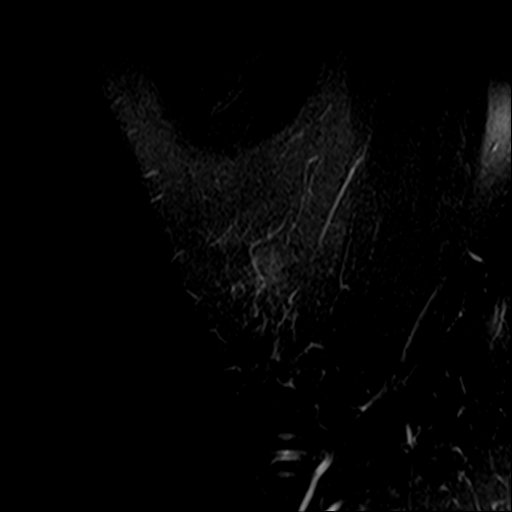
[im 6/28]
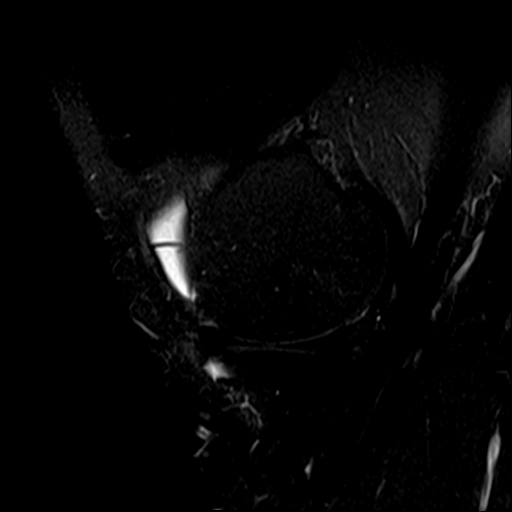
[im 11/28]
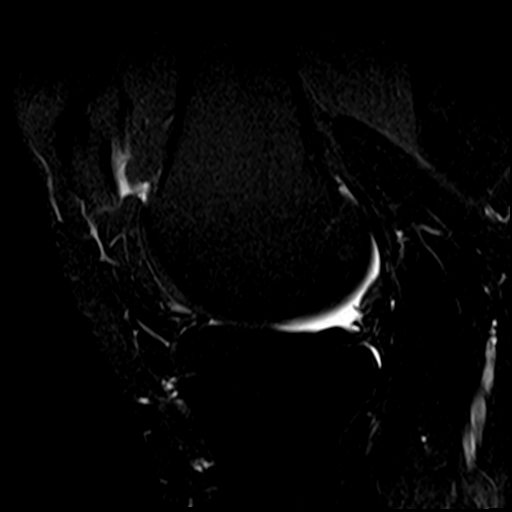
[im 17/28]
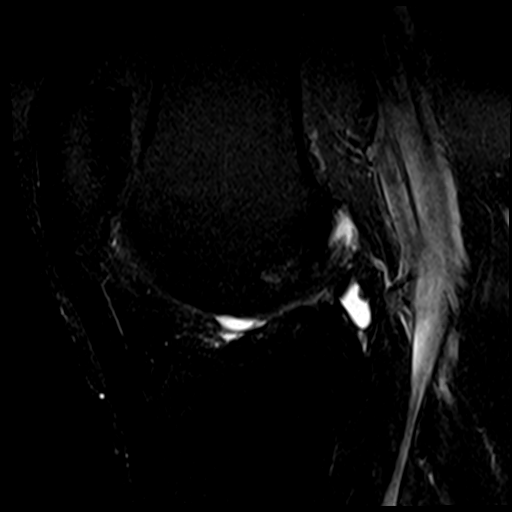
[im 22/28]
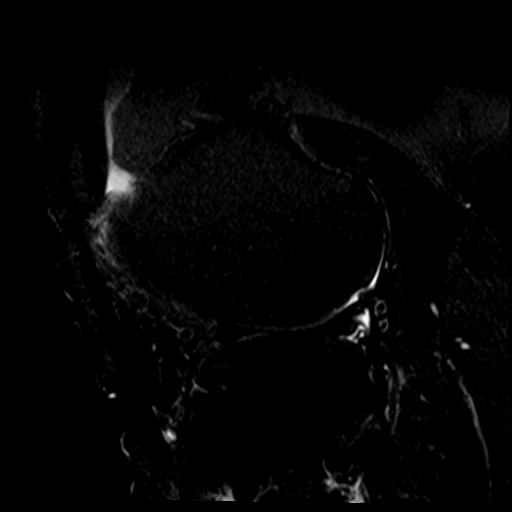
[im 28/28]
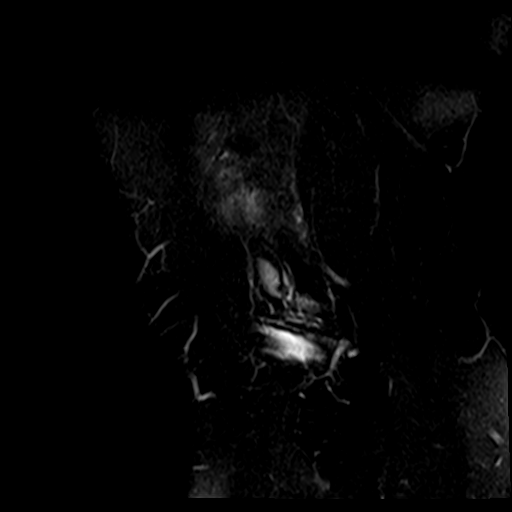

[23 of 40 positions shown; findings below may reference images not displayed]

FINDINGS: MENISCI

Medial meniscus:  Intact.

Lateral meniscus:  Intact.

LIGAMENTS

Cruciates:  Intact ACL and PCL.

Collaterals: Medial collateral ligament is intact with mild edema
superficial to the MCL concerning for mild MCL strain. Lateral
collateral ligament complex is intact.

CARTILAGE

Patellofemoral:  No chondral defect.

Medial:  No chondral defect.

Lateral:  No chondral defect.

Joint: Small joint effusion. Edema in superolateral Hoffa's fat. No
plical thickening.

Popliteal Fossa:  No Baker cyst. Intact popliteus tendon.

Extensor Mechanism: Intact quadriceps tendon. Intact patellar
tendon. Intact medial patellar retinaculum. Intact lateral patellar
retinaculum. Intact MPFL.

Bones:  No acute osseous abnormality.  No aggressive osseous lesion.

Other: No fluid collection or hematoma.
IMPRESSION: 1. Mild grade 1 MCL strain.  MCL is otherwise intact.
2. No meniscal or ligamentous injury of the right knee.
3. Edema in superolateral Hoffa's fat as can be seen with patellar
tendon-lateral femoral condyle friction syndrome.

## 2020-06-13 ENCOUNTER — Other Ambulatory Visit: Payer: Self-pay

## 2020-06-13 ENCOUNTER — Emergency Department (HOSPITAL_BASED_OUTPATIENT_CLINIC_OR_DEPARTMENT_OTHER)
Admission: EM | Admit: 2020-06-13 | Discharge: 2020-06-13 | Disposition: A | Discharge status: Home / Self Care | Payer: Commercial Managed Care - PPO | Attending: Emergency Medicine | Admitting: Emergency Medicine

## 2020-06-13 ENCOUNTER — Encounter (HOSPITAL_BASED_OUTPATIENT_CLINIC_OR_DEPARTMENT_OTHER): Payer: Self-pay

## 2020-06-13 DIAGNOSIS — F17210 Nicotine dependence, cigarettes, uncomplicated: Secondary | ICD-10-CM | POA: Insufficient documentation

## 2020-06-13 DIAGNOSIS — M25561 Pain in right knee: Secondary | ICD-10-CM | POA: Diagnosis not present

## 2020-06-13 DIAGNOSIS — M25461 Effusion, right knee: Secondary | ICD-10-CM | POA: Diagnosis not present

## 2020-06-13 DIAGNOSIS — Z7951 Long term (current) use of inhaled steroids: Secondary | ICD-10-CM | POA: Diagnosis not present

## 2020-06-13 DIAGNOSIS — J45909 Unspecified asthma, uncomplicated: Secondary | ICD-10-CM | POA: Insufficient documentation

## 2020-06-13 MED ORDER — PREDNISONE 50 MG PO TABS: 50.0000 mg | ORAL_TABLET | Freq: Every day | ORAL | 0 refills | Status: AC

## 2020-06-13 MED ORDER — KETOROLAC TROMETHAMINE 30 MG/ML IJ SOLN
15.0000 mg | Freq: Once | INTRAMUSCULAR | Status: AC
  Administered 2020-06-13: 15 mg via INTRAMUSCULAR
  Filled 2020-06-13: qty 1

## 2020-06-13 MED ORDER — ACETAMINOPHEN 500 MG PO TABS: 1000.0000 mg | ORAL_TABLET | Freq: Three times a day (TID) | ORAL | 0 refills | Status: AC | PRN

## 2020-06-13 NOTE — ED Provider Notes (Signed)
[facility] EMERGENCY DEPARTMENT Provider Note   CSN: [id number] Arrival date & time: 06/13/20  1802     History Chief Complaint  Patient presents with  . Knee Pain    Nathan Logan is a 34 y.o. male presenting for evaluation of right knee pain and swelling.  Patient states he has had knee issues on and off for the past year.  He is following with orthopedics, will waiting to get an MRI scheduled.  States over the past several days, he has had worsening pain and swelling of the right knee.  Is making it hard for him to work.  He is taking ibuprofen improvement symptoms.  He talked to his orthopedic doctor today, who states if they do another arthrocentesis his MRI will have to be delayed.  Patient is here for symptom control.  He denies fevers, chills, repeat trauma, numbness, or tingling.  He reports no other medical problems, takes no medications daily.  HPI     Past Medical History:  Diagnosis Date  . Allergic rhinitis   . Arthritis   . Asthma     There are no problems to display for this patient.   Past Surgical History:  Procedure Laterality Date  . HERNIA REPAIR    . VASECTOMY         History reviewed. No pertinent family history.  Social History   Tobacco Use  . Smoking status: Current Every Day Smoker    Packs/day: 0.50    Types: Cigarettes  . Smokeless tobacco: Former Engineer, water Use Topics  . Alcohol use: Yes    Comment: occasional  . Drug use: Never    Home Medications Prior to Admission medications   Medication Sig Start Date End Date Taking? Authorizing Provider  acetaminophen (TYLENOL) 500 MG tablet Take 2 tablets (1,000 mg total) by mouth every 8 (eight) hours as needed. 06/13/20   Lanisha Stepanian, PA-C  albuterol (PROVENTIL) (2.5 MG/3ML) 0.083% nebulizer solution Take 3 mLs (2.5 mg total) by nebulization every 6 (six) hours as needed for wheezing or shortness of breath. 03/29/18   Jacalyn Lefevre, MD  celecoxib  (CELEBREX) 200 MG capsule Take 1 capsule (200 mg total) by mouth 2 (two) times daily. 06/09/19   Jacalyn Lefevre, MD  HYDROcodone-acetaminophen (NORCO) 5-325 MG tablet Take 1 tablet by mouth daily as needed for moderate pain. 06/23/19   Cristie Hem, PA-C  naproxen (NAPROSYN) 500 MG tablet Take 1 tablet (500 mg total) by mouth 2 (two) times daily. 06/07/19   Horton, Mayer Masker, MD  predniSONE (DELTASONE) 50 MG tablet Take 1 tablet (50 mg total) by mouth daily for 5 days. 06/13/20 06/18/20  Shimon Trowbridge, PA-C    Allergies    Patient has no known allergies.  Review of Systems   Review of Systems  Musculoskeletal: Positive for arthralgias and joint swelling.  Neurological: Negative for numbness.    Physical Exam Updated Vital Signs BP (!) 136/98   Pulse 100   Temp 99.2 F (37.3 C) (Oral)   Resp 18   Ht 6\' 2"  (1.88 m)   Wt 134.3 kg   SpO2 100%   BMI 38.00 kg/m   Physical Exam Vitals and nursing note reviewed.  Constitutional:      General: He is not in acute distress.    Appearance: He is well-developed.  HENT:     Head: Normocephalic and atraumatic.  Pulmonary:     Effort: Pulmonary effort is normal.  Abdominal:  General: There is no distension.  Musculoskeletal:        General: Swelling and tenderness present. Normal range of motion.     Cervical back: Normal range of motion.     Comments: Swelling noted of the right knee.  No erythema or warmth.  No difficulty with ranging.  Generalized tenderness palpation of the knee.  No tenderness palpation of the calf or the thigh.  Pedal pulses 2+ bilaterally.  Good distal sensation.  Skin:    General: Skin is warm.     Capillary Refill: Capillary refill takes less than 2 seconds.     Findings: No rash.  Neurological:     Mental Status: He is alert and oriented to person, place, and time.     ED Results / Procedures / Treatments   Labs (all labs ordered are listed, but only abnormal results are displayed) Labs Reviewed  - No data to display  EKG None  Radiology No results found.  Procedures Procedures (including critical care time)  Medications Ordered in ED Medications  ketorolac (TORADOL) 30 MG/ML injection 15 mg (has no administration in time range)    ED Course  I have reviewed the triage vital signs and the nursing notes.  Pertinent labs & imaging results that were available during my care of the patient were reviewed by me and considered in my medical decision making (see chart for details).    MDM Rules/Calculators/A&P                          Presenting for evaluation of right knee pain and swelling.  On exam, patient appears nontoxic.  He is neurovascularly intact.  Exam is not consistent with septic joint.  Likely effusion due to underlying issue.  Per orthopedic note, there is concern for possible meniscus tear, which could be prompting the effusion.  Discussed with patient.  Discussed that ultimate management will need to be done with orthopedics.  Discussed symptomatic control with brace and pain management.  Will give patient a short course of prednisone, as he is already taking ibuprofen without improvement of symptoms.  Discussed importance of rest, ice, elevation.  Encourage close follow-up with orthopedics.  At this time, patient appears safe for discharge.  Return precautions given.  Patient states he understands and agrees to plan.  Final Clinical Impression(s) / ED Diagnoses Final diagnoses:  Acute pain of right knee  Effusion of right knee    Rx / DC Orders ED Discharge Orders         Ordered    predniSONE (DELTASONE) 50 MG tablet  Daily     Discontinue  Reprint     06/13/20 1930    acetaminophen (TYLENOL) 500 MG tablet  Every 8 hours PRN     Discontinue  Reprint     06/13/20 1932           Alveria Apley, PA-C 06/13/20 1938    Charlynne Pander, MD 06/13/20 [phone]

## 2020-06-13 NOTE — Discharge Instructions (Addendum)
Take prednisone as prescribed. While taking prednisone, have caution with anti-inflammatory such as ibuprofen.  It is preferred that you take Tylenol as needed for breakthrough pain. Use the knee brace to help with support and pain control. As part and you are keeping your leg up to help with swelling.  Use ice to help with pain and swelling. Follow-up with your orthopedic doctor for further management of your knee. Return to the emergency room if you develop fevers, numbness of your foot,.  Knee becomes hot and you are unable to move it, or with any new, worsening, or concerning symptoms.

## 2020-06-13 NOTE — ED Triage Notes (Signed)
R knee pain that began a 'while ago', has been drained multiple times. Basketball injury x 1 year ago, painful to bend. Tender to touch in triage. Takes ibuprofen @ night w/out relief

## 2023-06-16 ENCOUNTER — Encounter (HOSPITAL_BASED_OUTPATIENT_CLINIC_OR_DEPARTMENT_OTHER): Payer: Self-pay

## 2023-06-16 ENCOUNTER — Other Ambulatory Visit: Payer: Self-pay

## 2023-06-16 ENCOUNTER — Emergency Department (HOSPITAL_BASED_OUTPATIENT_CLINIC_OR_DEPARTMENT_OTHER)
Admission: EM | Admit: 2023-06-16 | Discharge: 2023-06-16 | Disposition: A | Discharge status: Home / Self Care | Payer: Commercial Managed Care - PPO | Attending: Emergency Medicine | Admitting: Emergency Medicine

## 2023-06-16 DIAGNOSIS — R21 Rash and other nonspecific skin eruption: Secondary | ICD-10-CM | POA: Diagnosis present

## 2023-06-16 DIAGNOSIS — L259 Unspecified contact dermatitis, unspecified cause: Secondary | ICD-10-CM

## 2023-06-16 DIAGNOSIS — H5711 Ocular pain, right eye: Secondary | ICD-10-CM | POA: Insufficient documentation

## 2023-06-16 MED ORDER — TETRACAINE HCL 0.5 % OP SOLN
2.0000 [drp] | Freq: Once | OPHTHALMIC | Status: DC
  Filled 2023-06-16: qty 4

## 2023-06-16 MED ORDER — FLUORESCEIN SODIUM 1 MG OP STRP
1.0000 | ORAL_STRIP | Freq: Once | OPHTHALMIC | Status: DC
  Filled 2023-06-16: qty 1

## 2023-06-16 MED ORDER — PREDNISONE 10 MG PO TABS: 20.0000 mg | ORAL_TABLET | Freq: Two times a day (BID) | ORAL | 0 refills | Status: AC

## 2023-06-16 NOTE — ED Triage Notes (Signed)
Pt reports 4 days of RT eye pain. Pt states it just felt like something was in his eye. Pt also reports pain to the RT ear that feels inflamed. RT lobe appears to have a wound. Pt states he had a fluid filled sac that burst and it bled a bit. Pt also reports that his BUE had small bumps that are very itchy and unsure if this is all related. NO new foods or medication. Pt has not taken benadryl or other meds to help symptoms.

## 2023-06-16 NOTE — ED Provider Notes (Signed)
[place] EMERGENCY DEPARTMENT AT [facility] Provider Note   CSN: [id number] Arrival date & time: 06/16/23  4098     History  Chief Complaint  Patient presents with   Eye Pain   Rash    Nathan Logan is a 37 y.o. male.  Patient is a 37 year old male presenting with complaints of rash to the right ear and around his right eye.  This has been getting worse over the past several days.  It is itchy.  He denies any new contacts or exposures.  He has been working in the yard clearing some trees, but denies any definitive exposure.  The history is provided by the patient.       Home Medications Prior to Admission medications   Medication Sig Start Date End Date Taking? Authorizing Provider  acetaminophen (TYLENOL) 500 MG tablet Take 2 tablets (1,000 mg total) by mouth every 8 (eight) hours as needed. 06/13/20   Caccavale, Sophia, PA-C  albuterol (PROVENTIL) (2.5 MG/3ML) 0.083% nebulizer solution Take 3 mLs (2.5 mg total) by nebulization every 6 (six) hours as needed for wheezing or shortness of breath. 03/29/18   Jacalyn Lefevre, MD  celecoxib (CELEBREX) 200 MG capsule Take 1 capsule (200 mg total) by mouth 2 (two) times daily. 06/09/19   Jacalyn Lefevre, MD  HYDROcodone-acetaminophen (NORCO) 5-325 MG tablet Take 1 tablet by mouth daily as needed for moderate pain. 06/23/19   Cristie Hem, PA-C  naproxen (NAPROSYN) 500 MG tablet Take 1 tablet (500 mg total) by mouth 2 (two) times daily. 06/07/19   Horton, Mayer Masker, MD      Allergies    Patient has no known allergies.    Review of Systems   Review of Systems  All other systems reviewed and are negative.   Physical Exam Updated Vital Signs BP (!) 129/90 (BP Location: Right Arm)   Pulse 78   Temp 97.8 F (36.6 C) (Oral)   Resp 18   Ht 6\' 2"  (1.88 m)   Wt 136.1 kg   SpO2 98%   BMI 38.52 kg/m  Physical Exam Vitals and nursing note reviewed.  Eyes:     Comments: The eye itself is normal in  appearance.  There is no conjunctival injection and cornea is clear.  Pulmonary:     Effort: Pulmonary effort is normal.  Skin:    General: Skin is warm and dry.     Comments: To the right external ear and periorbital soft tissues, there is a macular, raised rash consistent with a contact dermatitis.  Neurological:     Mental Status: He is alert.     ED Results / Procedures / Treatments   Labs (all labs ordered are listed, but only abnormal results are displayed) Labs Reviewed - No data to display  EKG None  Radiology No results found.  Procedures Procedures    Medications Ordered in ED Medications  tetracaine (PONTOCAINE) 0.5 % ophthalmic solution 2 drop (has no administration in time range)  fluorescein ophthalmic strip 1 strip (has no administration in time range)    ED Course/ Medical Decision Making/ A&P  Patient with what appears to be a contact dermatitis around his right eye and on the right external ear.  This to be treated with prednisone and antihistamines.  To follow-up as needed.  Final Clinical Impression(s) / ED Diagnoses Final diagnoses:  None    Rx / DC Orders ED Discharge Orders     None  Geoffery Lyons, MD 06/16/23 [phone]

## 2023-06-16 NOTE — Discharge Instructions (Signed)
 Begin taking prednisone as prescribed.    Begin taking Benadryl 25 mg every 6 hours for the next 3 days.  This medication is available over-the-counter.  Return to the ER if symptoms significantly worsen or change.
